# Patient Record
Sex: Female | Born: 1990 | Hispanic: Yes | Marital: Single
Health system: Southern US, Community
[De-identification: ages and names within clinical notes are randomized; demographics above are authoritative.]

---

## 2015-01-25 ENCOUNTER — Other Ambulatory Visit: Payer: Self-pay | Admitting: Nurse Practitioner

## 2015-01-25 DIAGNOSIS — R102 Pelvic and perineal pain: Secondary | ICD-10-CM

## 2015-01-25 DIAGNOSIS — R1013 Epigastric pain: Secondary | ICD-10-CM

## 2015-01-26 ENCOUNTER — Ambulatory Visit
Admission: RE | Admit: 2015-01-26 | Discharge: 2015-01-26 | Disposition: A | Discharge status: Home / Self Care | Payer: BLUE CROSS/BLUE SHIELD | Source: Ambulatory Visit | Attending: Nurse Practitioner | Admitting: Nurse Practitioner

## 2015-01-26 DIAGNOSIS — R102 Pelvic and perineal pain: Secondary | ICD-10-CM

## 2015-01-26 DIAGNOSIS — R1013 Epigastric pain: Secondary | ICD-10-CM

## 2015-01-27 ENCOUNTER — Telehealth: Payer: Self-pay | Admitting: Obstetrics and Gynecology

## 2015-01-27 NOTE — Telephone Encounter (Signed)
TC from [facility] answering service--patient called with "continuing pain".  Patient of Dr. Dion Body.  LM on patient's cell phone for her to call [facility] answering service again to get connected with me.  Reviewed EPIC--patient had pelvic and abdominal US on 8/19 at [facility]. Gallbladder sludge noted, no other abnormal findings.  Will await her f/u call.  Nigel Bridgeman, CNM 01/27/15 10:20a

## 2015-01-27 NOTE — Telephone Encounter (Signed)
11:15a--patient called back. Still having pain, but now on left side, around her stomach area.  Mildly constipated, mild nausea, no vomiting.  "Always feel full".  Advises pain has moved around since evaluation at [facility]. Had normal pelvic exam at [facility] this week, with negative pelvic and abdominal US 01/26/15.  Advised patient pain more likely GI in etiology--she told me the NP at [facility] "was going to give me pain med".  Has been taking Ibuprophen without benefit. Advised I recommended ER evaluation of pain, not at [facility].  Patient prefers not to go to ER if possible.  Advised patient I would defer any narcotic Rx over the weekend, but offered Bentyl as option. She is agreeable with that med. Recommend evaluation in ER if patient was having pain not controlled with med, or with any other worsening of sx.  Rx Bentyl 20 mg po TID prn, #24, no refills, to [facility], on [address].  Nigel Bridgeman, CNM 01/27/15 11:15a

## 2016-06-16 IMAGING — US US PELVIS COMPLETE
1 series · 14 of 25 positions shown · non-contrast
Comparison: None

CLINICAL DATA: Pelvic pain



[Series 1: us pelvis complete · 0.24mm/px · 14 of 54 slices shown]
[im 1/54]
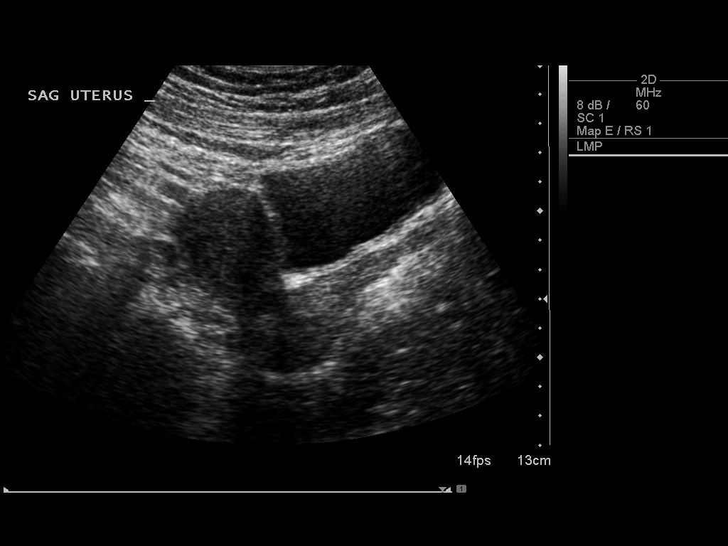
[im 5/54]
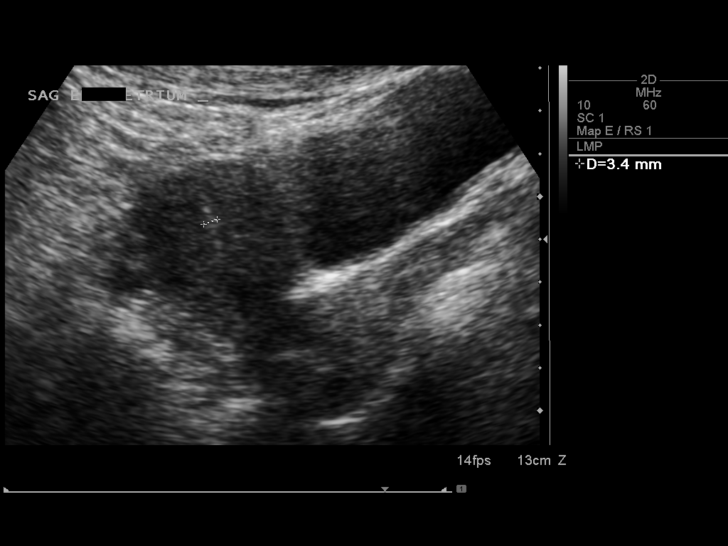
[im 9/54]
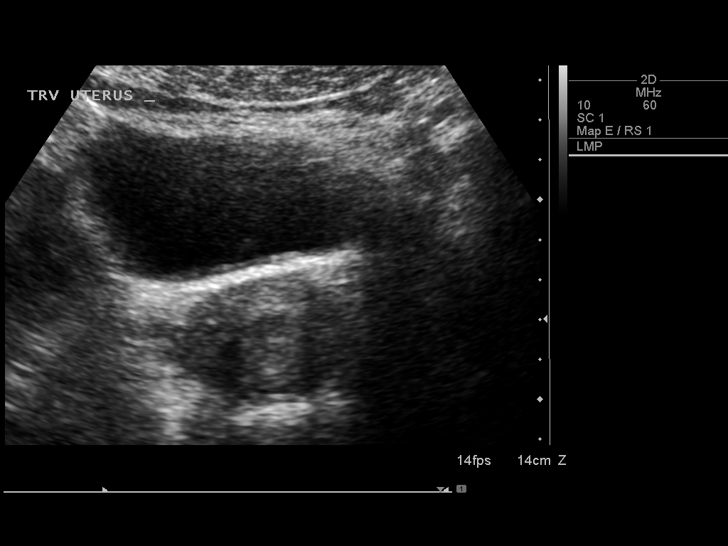
[im 14/54]
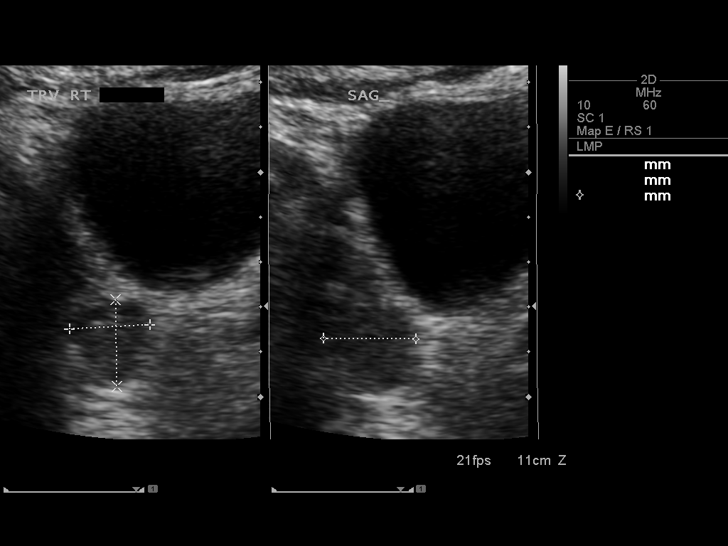
[im 18/54]
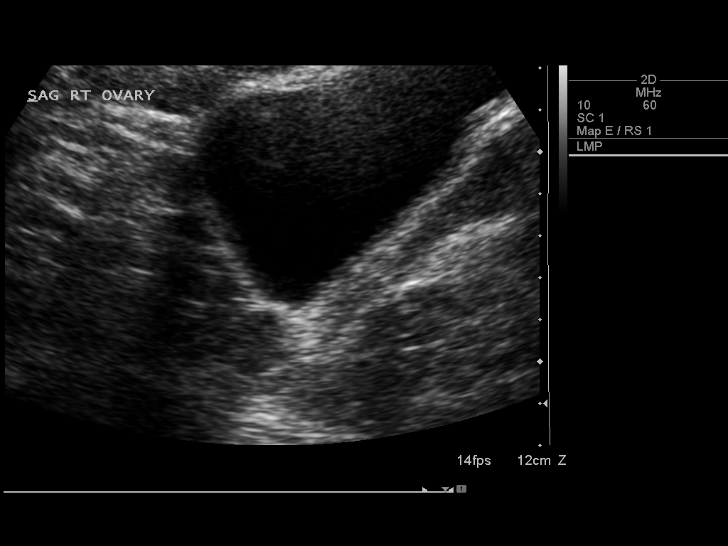
[im 20/54]
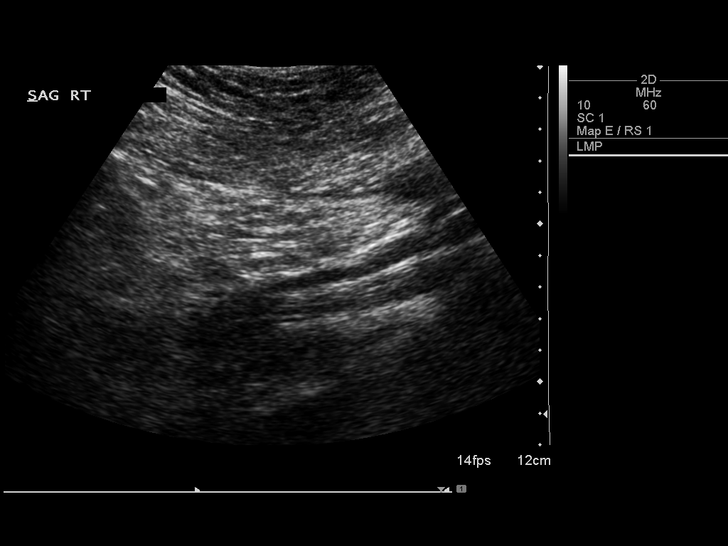
[im 25/54]
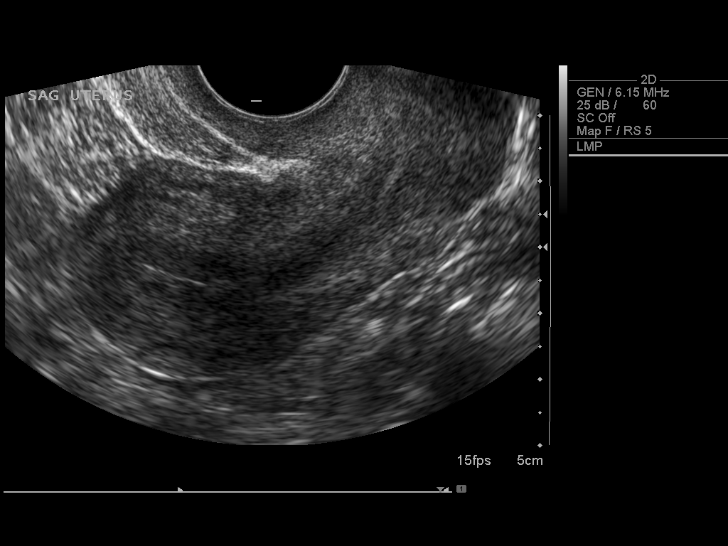
[im 29/54]
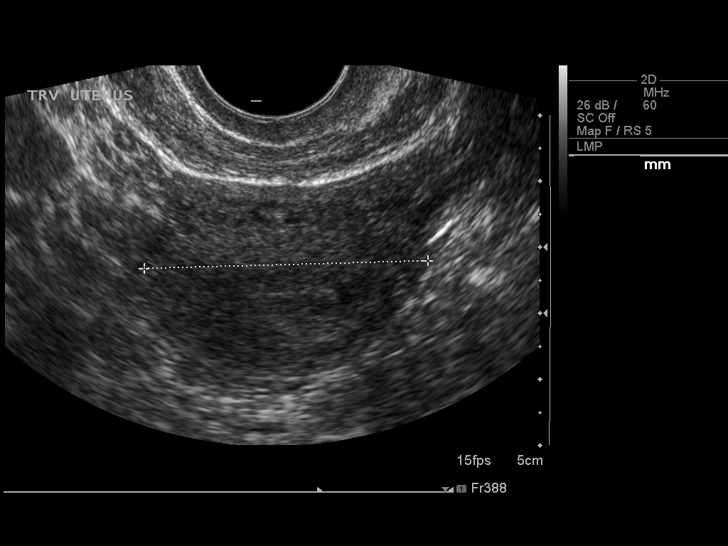
[im 34/54]
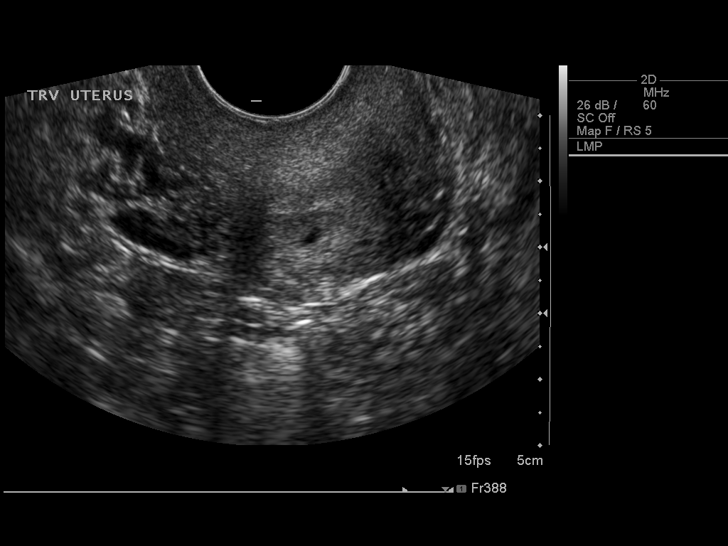
[im 36/54]
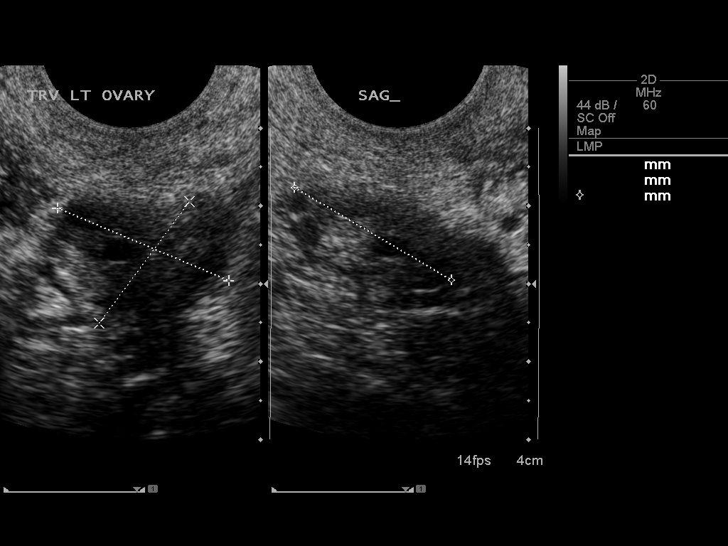
[im 40/54]
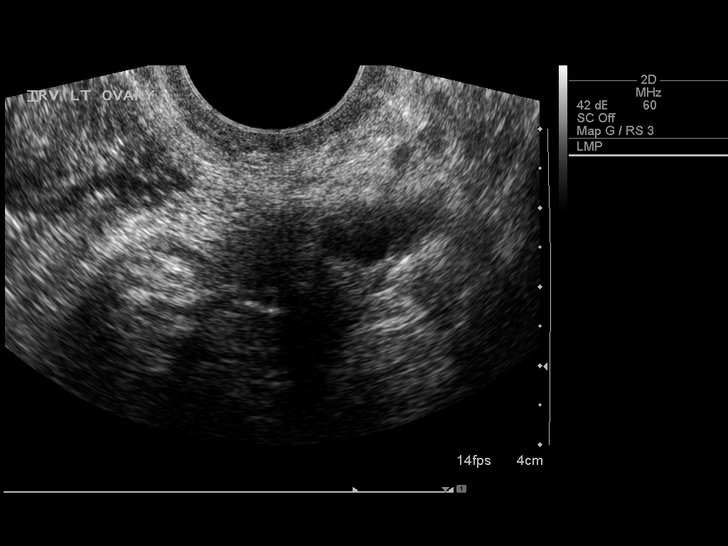
[im 45/54]
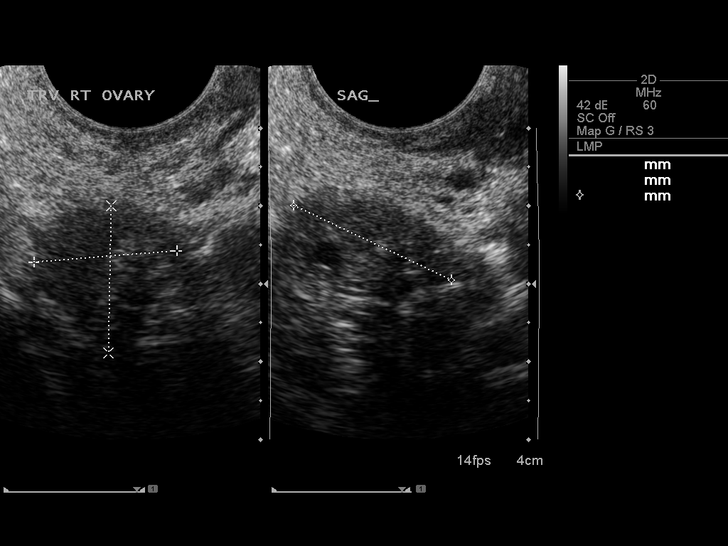
[im 49/54]
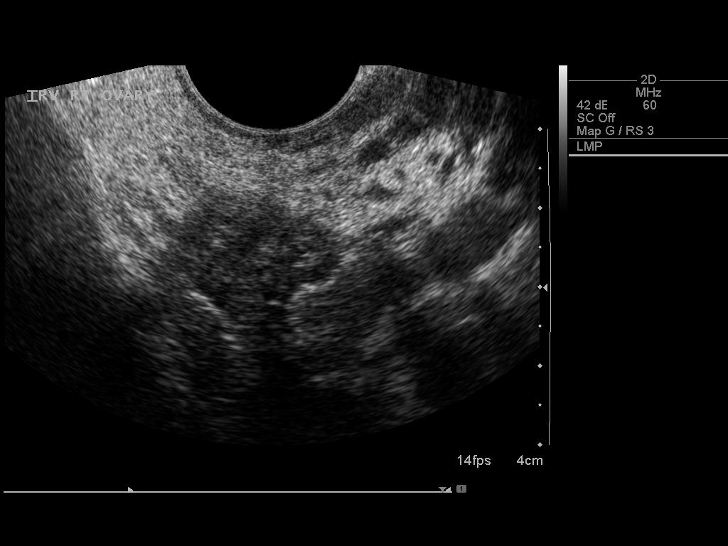
[im 54/54]
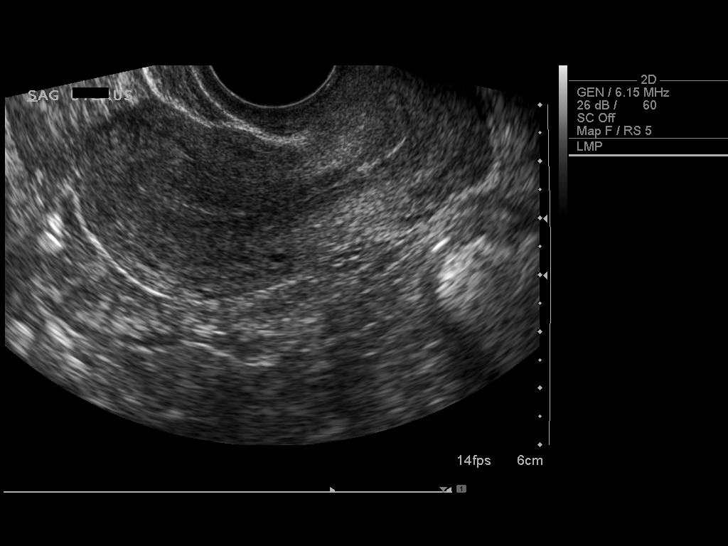

[14 of 25 positions shown; findings below may reference images not displayed]

FINDINGS: Uterus

Measurements: 7.4 x 3.6 x 4.3 cm.. No fibroids or other mass
visualized.

Endometrium

Thickness: 10 mm..  No focal abnormality visualized.

Right ovary

Measurements: 1.8 x 1.9 x 2.2 cm.. Normal appearance/no adnexal
mass.

Left ovary

Measurements: 2.4 x 1.9 x 2.3 cm.. Normal appearance/no adnexal
mass.

Other findings

No free fluid.
IMPRESSION: No acute abnormality noted.

## 2016-06-16 IMAGING — US US ABDOMEN COMPLETE
1 series · 14 of 25 positions shown · non-contrast
Comparison: None.

CLINICAL DATA: Biliary sludge.

EXAM:
ULTRASOUND ABDOMEN COMPLETE

[Series 1: us abdomen complete · 0.32mm/px · 14 of 73 slices shown]
[im 1/73]
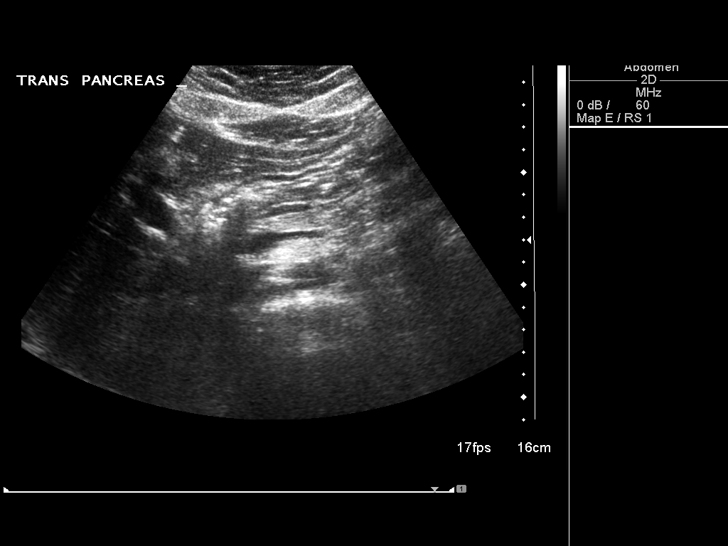
[im 7/73]
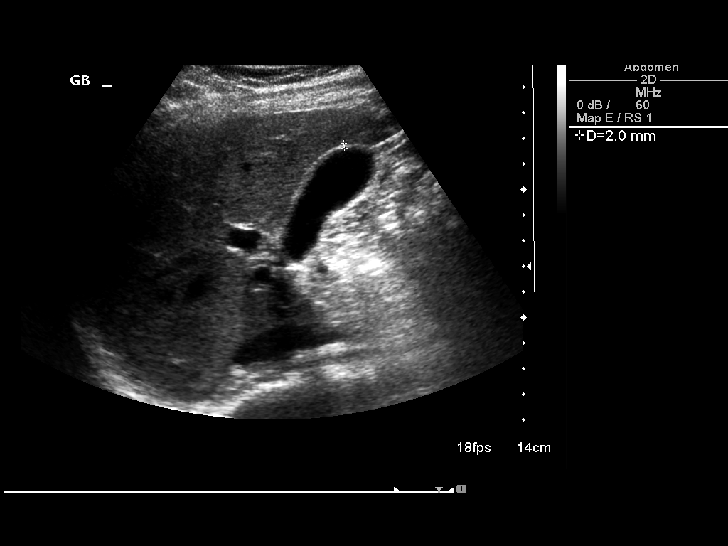
[im 13/73]
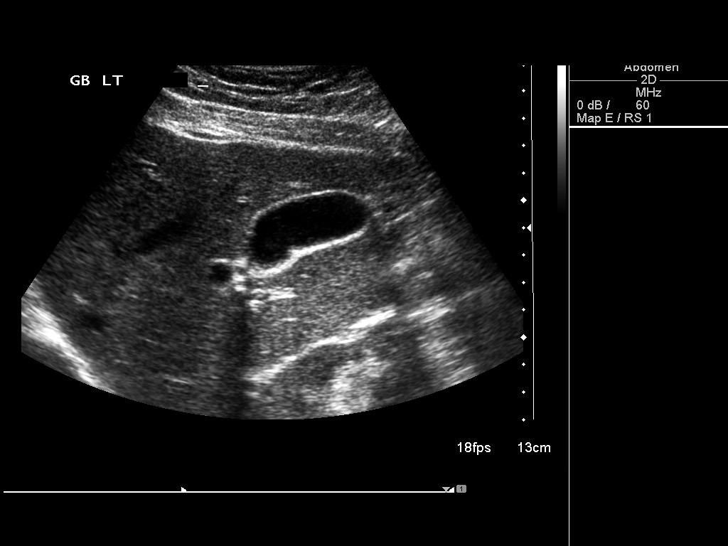
[im 19/73]
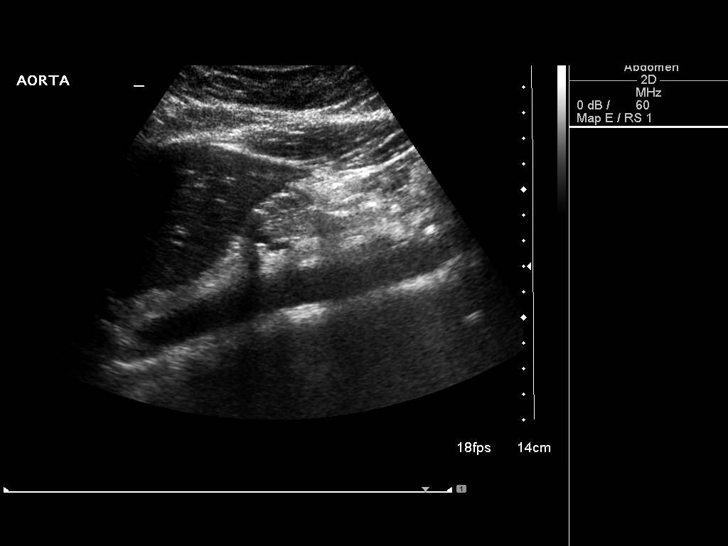
[im 25/73]
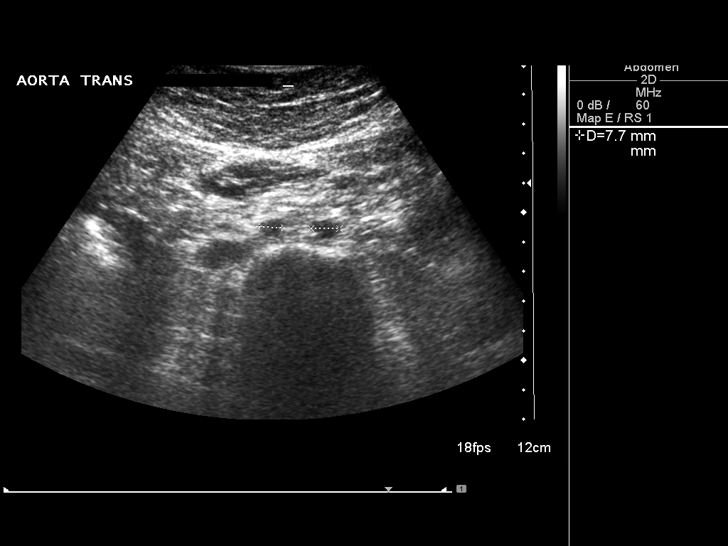
[im 28/73]
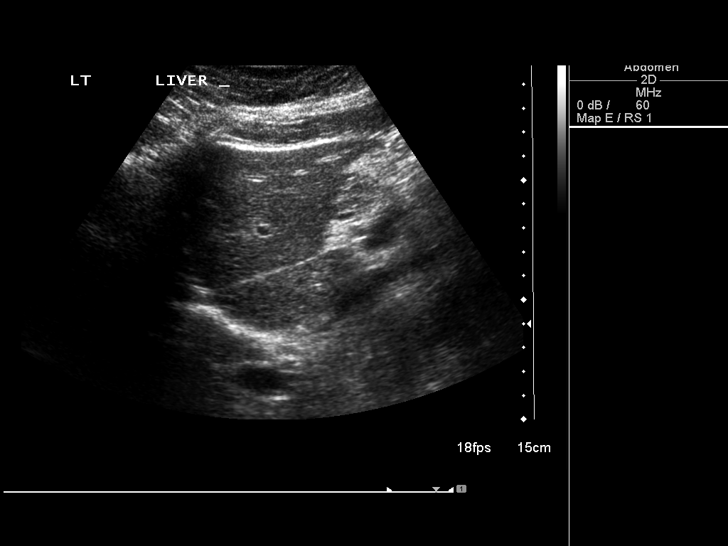
[im 34/73]
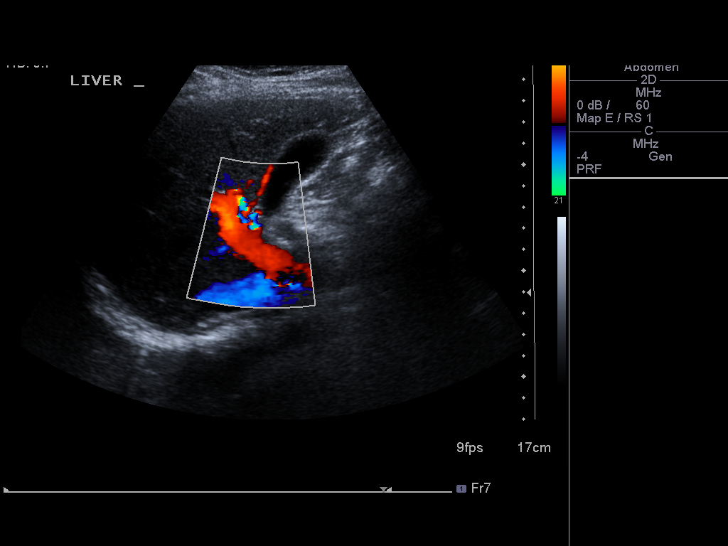
[im 40/73]
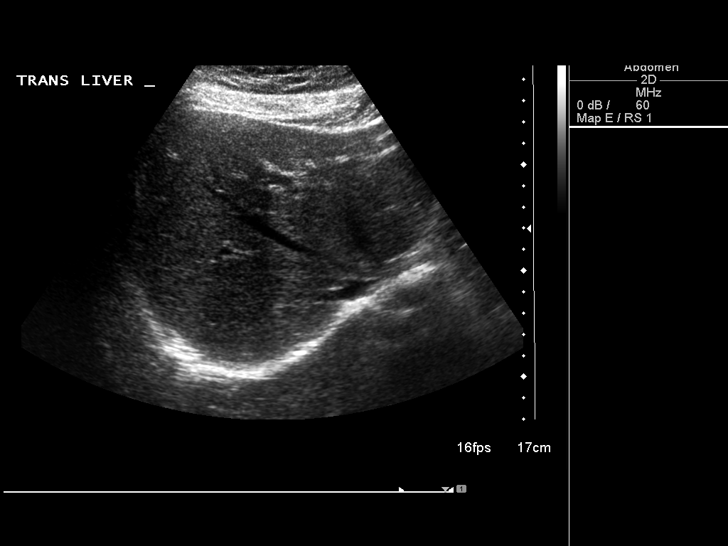
[im 46/73]
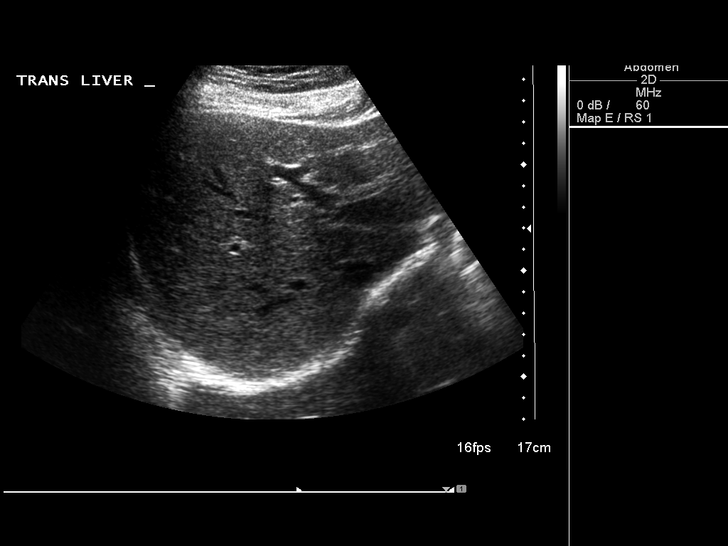
[im 49/73]
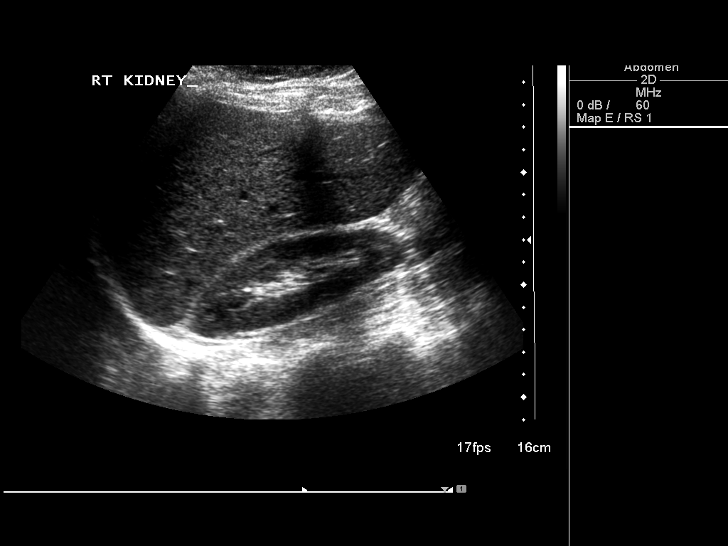
[im 55/73]
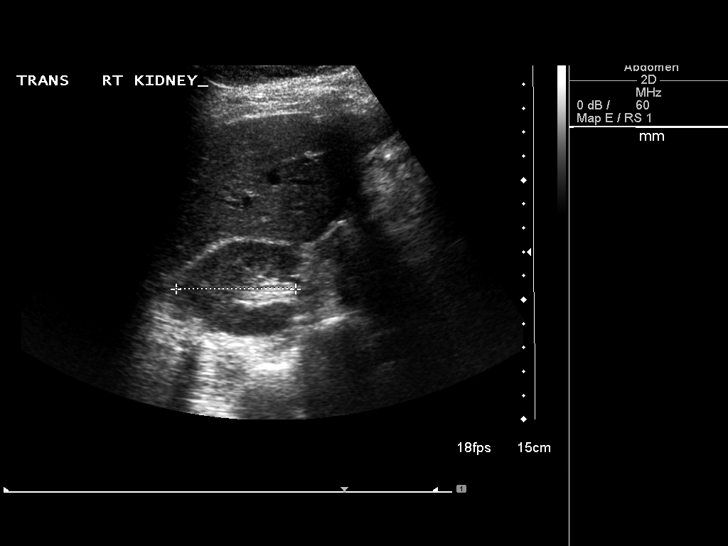
[im 61/73]
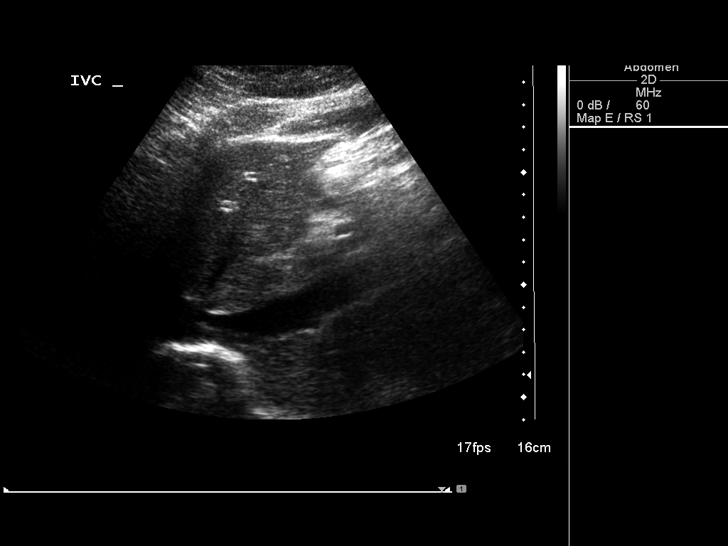
[im 67/73]
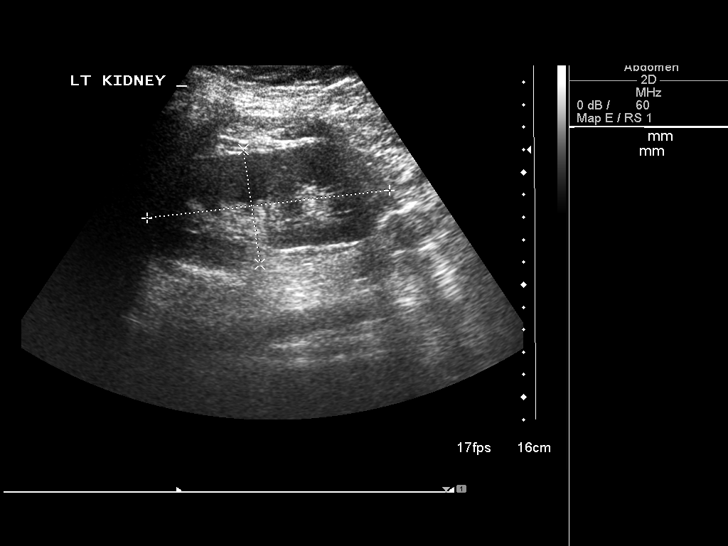
[im 73/73]
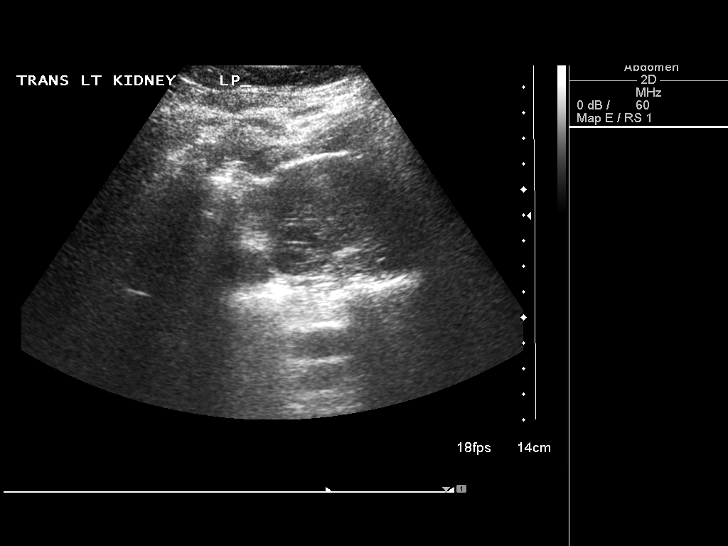

[14 of 25 positions shown; findings below may reference images not displayed]

FINDINGS: Gallbladder: Mild biliary sludge. No gallstones.Gallbladder wall
thickness normal at 2 mm . No pericholecystic fluid collection.

Common bile duct: Diameter: 2.7 mm

Liver: No focal lesion identified. Within normal limits in
parenchymal echogenicity.

IVC: No abnormality visualized.

Pancreas: Visualized portion unremarkable.

Spleen: Size and appearance within normal limits.

Right Kidney: Length: 10.6 cm. Echogenicity within normal limits. No
mass or hydronephrosis visualized.

Left Kidney: Length: 10.9 cm. Echogenicity within normal limits. No
mass or hydronephrosis visualized.

Abdominal aorta: No aneurysm visualized.

Other findings: None.
IMPRESSION: Mild amount of biliary sludge, otherwise negative exam.

## 2018-09-23 ENCOUNTER — Telehealth: Payer: Self-pay | Admitting: Gastroenterology

## 2018-09-23 NOTE — Telephone Encounter (Signed)
Received a fax from [facility] stating they denied Carafate 1 gm/10 ml OR SUSP. The reason for the denial: The request does not meet the definition of Medical Necessity found in member's benefit booklet. I have place this information on you desk.

## 2018-11-23 ENCOUNTER — Other Ambulatory Visit: Payer: Self-pay
